# Patient Record
Sex: Female | Born: 2000 | Hispanic: Yes | Marital: Single | Smoking: Never smoker
Health system: Southern US, Community
[De-identification: ages and names within clinical notes are randomized; demographics above are authoritative.]

## PROBLEM LIST (undated history)

## (undated) DIAGNOSIS — Z9889 Other specified postprocedural states: Secondary | ICD-10-CM

## (undated) DIAGNOSIS — R112 Nausea with vomiting, unspecified: Secondary | ICD-10-CM

## (undated) DIAGNOSIS — S83512A Sprain of anterior cruciate ligament of left knee, initial encounter: Secondary | ICD-10-CM

## (undated) DIAGNOSIS — S83289A Other tear of lateral meniscus, current injury, unspecified knee, initial encounter: Secondary | ICD-10-CM

---
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
## 2020-03-10 ENCOUNTER — Other Ambulatory Visit: Payer: Self-pay

## 2020-03-10 ENCOUNTER — Encounter (HOSPITAL_BASED_OUTPATIENT_CLINIC_OR_DEPARTMENT_OTHER): Payer: Self-pay | Admitting: Orthopaedic Surgery

## 2020-03-15 ENCOUNTER — Other Ambulatory Visit (HOSPITAL_COMMUNITY)
Admission: RE | Admit: 2020-03-15 | Discharge: 2020-03-15 | Disposition: A | Discharge status: Home / Self Care | Payer: Medicaid Other | Source: Ambulatory Visit | Attending: Orthopaedic Surgery | Admitting: Orthopaedic Surgery

## 2020-03-15 DIAGNOSIS — Z01812 Encounter for preprocedural laboratory examination: Secondary | ICD-10-CM | POA: Insufficient documentation

## 2020-03-15 DIAGNOSIS — Z20822 Contact with and (suspected) exposure to covid-19: Secondary | ICD-10-CM | POA: Insufficient documentation

## 2020-03-15 LAB — SARS CORONAVIRUS 2 (TAT 6-24 HRS): SARS Coronavirus 2: NEGATIVE

## 2020-03-15 NOTE — Progress Notes (Signed)

## 2020-03-17 NOTE — H&P (Signed)
PREOPERATIVE H&P  Chief Complaint: RIGHT KNEE CRUCIATE LIGAMENT SPRAIN S83.509A  HPI: Jennifer Macias is a 19 y.o. female who is scheduled for  RIGHT KNEE ARTHROSCOPY WITH ANTERIOR CRUCIATE LIGAMENT (ACL) RECONSTRUCTION.   This is a healthy 19 year old who Dr. Everardo Pacific performed a left BTB ACL reconstruction on in December 2018. She unfortunately had an injury to her right knee on July 31 while playing soccer.  She heard a pop.  She had immediate swelling and pain.  Pain has gotten better.  She still has some pain in the back of her knee.    Her symptoms are rated as moderate to severe, and have been worsening.  This is significantly impairing activities of daily living.    Please see clinic note for further details on this patient's care.    She has elected for surgical management.   Past Medical History:  Diagnosis Date  . Lateral meniscus tear 06/2017   left knee  . Left ACL tear 06/2017  . PONV (postoperative nausea and vomiting)    Past Surgical History:  Procedure Laterality Date  . CLOSED REDUCTION AND PERCUTANEOUS PINNING OF HUMERUS FRACTURE Right 09/12/2004  . KNEE ARTHROSCOPY WITH ANTERIOR CRUCIATE LIGAMENT (ACL) REPAIR Left 07/05/2017   Procedure: LEFT KNEE ARTHROSCOPY LATERAL MENISCECTOMY,  ANTERIOR CRUCIATE LIGAMENT AUTOGRAFT BONE TENDON BONE;  Surgeon: Bjorn Pippin, MD;  Location: [facility];  Service: Orthopedics;  Laterality: Left;  . KNEE ARTHROSCOPY WITH LATERAL MENISECTOMY Left 07/05/2017   Procedure: KNEE ARTHROSCOPY WITH LATERAL MENISECTOMY;  Surgeon: Bjorn Pippin, MD;  Location: [facility];  Service: Orthopedics;  Laterality: Left;   Social History   Socioeconomic History  . Marital status: Single    Spouse name: Not on file  . Number of children: Not on file  . Years of education: Not on file  . Highest education level: Not on file  Occupational History  . Not on file  Tobacco Use  . Smoking status: Never  Smoker  . Smokeless tobacco: Never Used  Vaping Use  . Vaping Use: Never used  Substance and Sexual Activity  . Alcohol use: No  . Drug use: No  . Sexual activity: Not on file  Other Topics Concern  . Not on file  Social History Narrative  . Not on file   Social Determinants of Health   Financial Resource Strain:   . Difficulty of Paying Living Expenses: Not on file  Food Insecurity:   . Worried About Programme researcher, broadcasting/film/video in the Last Year: Not on file  . Ran Out of Food in the Last Year: Not on file  Transportation Needs:   . Lack of Transportation (Medical): Not on file  . Lack of Transportation (Non-Medical): Not on file  Physical Activity:   . Days of Exercise per Week: Not on file  . Minutes of Exercise per Session: Not on file  Stress:   . Feeling of Stress : Not on file  Social Connections:   . Frequency of Communication with Friends and Family: Not on file  . Frequency of Social Gatherings with Friends and Family: Not on file  . Attends Religious Services: Not on file  . Active Member of Clubs or Organizations: Not on file  . Attends Banker Meetings: Not on file  . Marital Status: Not on file   Family History  Problem Relation Age of Onset  . Diabetes Maternal Grandmother    No Known Allergies Prior to  Admission medications   Not on File    ROS: All other systems have been reviewed and were otherwise negative with the exception of those mentioned in the HPI and as above.  Physical Exam: General: Alert, no acute distress Cardiovascular: No pedal edema Respiratory: No cyanosis, no use of accessory musculature GI: No organomegaly, abdomen is soft and non-tender Skin: No lesions in the area of chief complaint Neurologic: Sensation intact distally Psychiatric: Patient is competent for consent with normal mood and affect Lymphatic: No axillary or cervical lymphadenopathy  MUSCULOSKELETAL:  Examination of the right knee demonstrates equivocal  Lachman with a bit of increased translation, laterally more than medially.  She is tender to palpation along the lateral joint line.  She has mild effusion.    Imaging: Three views of the knee demonstrate stable and appropriate position of the joint.  No sign of arthritis or other fracture.    MRI of the right knee shows a complete ACL tear  Assessment: RIGHT KNEE CRUCIATE LIGAMENT SPRAIN S83.509A  Plan: Plan for Procedure(s): RIGHT KNEE ARTHROSCOPY WITH ANTERIOR CRUCIATE LIGAMENT (ACL) RECONSTRUCTION  The risks benefits and alternatives were discussed with the patient including but not limited to the risks of nonoperative treatment, versus surgical intervention including infection, bleeding, nerve injury,  blood clots, cardiopulmonary complications, morbidity, mortality, among others, and they were willing to proceed.   The patient acknowledged the explanation, agreed to proceed with the plan and consent was signed.   Operative Plan: Right knee scope + ACL reconstruction with BTB autograft  Discharge Medications: Standard DVT Prophylaxis: Aspirin Physical Therapy: Outpatient PT Special Discharge needs: Knee immobilizer   Vernetta Honey, PA-C  03/17/2020 1:51 PM

## 2020-03-18 ENCOUNTER — Other Ambulatory Visit: Payer: Self-pay

## 2020-03-18 ENCOUNTER — Ambulatory Visit (HOSPITAL_BASED_OUTPATIENT_CLINIC_OR_DEPARTMENT_OTHER): Payer: Medicaid Other | Admitting: Anesthesiology

## 2020-03-18 ENCOUNTER — Encounter (HOSPITAL_BASED_OUTPATIENT_CLINIC_OR_DEPARTMENT_OTHER): Payer: Self-pay | Admitting: Orthopaedic Surgery

## 2020-03-18 ENCOUNTER — Encounter (HOSPITAL_BASED_OUTPATIENT_CLINIC_OR_DEPARTMENT_OTHER)
Admission: RE | Disposition: A | Discharge status: Home / Self Care | Payer: Self-pay | Source: Home / Self Care | Attending: Orthopaedic Surgery

## 2020-03-18 ENCOUNTER — Ambulatory Visit (HOSPITAL_BASED_OUTPATIENT_CLINIC_OR_DEPARTMENT_OTHER)
Admission: RE | Admit: 2020-03-18 | Discharge: 2020-03-18 | Disposition: A | Discharge status: Home / Self Care | Payer: Medicaid Other | Attending: Orthopaedic Surgery | Admitting: Orthopaedic Surgery

## 2020-03-18 DIAGNOSIS — X58XXXA Exposure to other specified factors, initial encounter: Secondary | ICD-10-CM | POA: Diagnosis not present

## 2020-03-18 DIAGNOSIS — S83511A Sprain of anterior cruciate ligament of right knee, initial encounter: Secondary | ICD-10-CM | POA: Insufficient documentation

## 2020-03-18 DIAGNOSIS — Y9366 Activity, soccer: Secondary | ICD-10-CM | POA: Diagnosis not present

## 2020-03-18 HISTORY — DX: Nausea with vomiting, unspecified: R11.2

## 2020-03-18 HISTORY — PX: KNEE ARTHROSCOPY WITH ANTERIOR CRUCIATE LIGAMENT (ACL) REPAIR: SHX5644

## 2020-03-18 HISTORY — DX: Other specified postprocedural states: Z98.890

## 2020-03-18 LAB — POCT PREGNANCY, URINE: Preg Test, Ur: NEGATIVE

## 2020-03-18 SURGERY — KNEE ARTHROSCOPY WITH ANTERIOR CRUCIATE LIGAMENT (ACL) REPAIR
Anesthesia: Regional | Site: Knee | Laterality: Right

## 2020-03-18 MED ORDER — PHENYLEPHRINE HCL (PRESSORS) 10 MG/ML IV SOLN
INTRAVENOUS | Status: DC | PRN
  Administered 2020-03-18 (×2): 80 ug via INTRAVENOUS

## 2020-03-18 MED ORDER — MEPERIDINE HCL 25 MG/ML IJ SOLN
INTRAMUSCULAR | Status: AC
  Filled 2020-03-18: qty 1

## 2020-03-18 MED ORDER — EPHEDRINE 5 MG/ML INJ
INTRAVENOUS | Status: AC
  Filled 2020-03-18: qty 10

## 2020-03-18 MED ORDER — ONDANSETRON HCL 4 MG/2ML IJ SOLN
INTRAMUSCULAR | Status: AC
  Filled 2020-03-18: qty 2

## 2020-03-18 MED ORDER — BUPIVACAINE HCL (PF) 0.25 % IJ SOLN
INTRAMUSCULAR | Status: AC
  Filled 2020-03-18: qty 30

## 2020-03-18 MED ORDER — DEXAMETHASONE SODIUM PHOSPHATE 10 MG/ML IJ SOLN
INTRAMUSCULAR | Status: AC
  Filled 2020-03-18: qty 1

## 2020-03-18 MED ORDER — PROMETHAZINE HCL 25 MG/ML IJ SOLN: 6.2500 mg | INTRAMUSCULAR | Status: DC | PRN

## 2020-03-18 MED ORDER — SODIUM CHLORIDE 0.9 % IR SOLN
Status: DC | PRN
  Administered 2020-03-18: 6000 mL

## 2020-03-18 MED ORDER — PROPOFOL 500 MG/50ML IV EMUL
INTRAVENOUS | Status: AC
  Filled 2020-03-18: qty 150

## 2020-03-18 MED ORDER — CEFAZOLIN SODIUM-DEXTROSE 2-4 GM/100ML-% IV SOLN
2.0000 g | INTRAVENOUS | Status: AC
  Administered 2020-03-18: 10:00:00 2 g via INTRAVENOUS

## 2020-03-18 MED ORDER — FENTANYL CITRATE (PF) 100 MCG/2ML IJ SOLN
INTRAMUSCULAR | Status: AC
  Filled 2020-03-18: qty 2

## 2020-03-18 MED ORDER — DEXAMETHASONE SODIUM PHOSPHATE 10 MG/ML IJ SOLN
INTRAMUSCULAR | Status: AC
  Filled 2020-03-18: qty 2

## 2020-03-18 MED ORDER — ROPIVACAINE HCL 5 MG/ML IJ SOLN
INTRAMUSCULAR | Status: DC | PRN
  Administered 2020-03-18: 30 mL via PERINEURAL

## 2020-03-18 MED ORDER — LIDOCAINE 2% (20 MG/ML) 5 ML SYRINGE
INTRAMUSCULAR | Status: AC
  Filled 2020-03-18: qty 15

## 2020-03-18 MED ORDER — LIDOCAINE 2% (20 MG/ML) 5 ML SYRINGE
INTRAMUSCULAR | Status: AC
  Filled 2020-03-18: qty 5

## 2020-03-18 MED ORDER — EPINEPHRINE PF 1 MG/ML IJ SOLN
INTRAMUSCULAR | Status: AC
  Filled 2020-03-18: qty 2

## 2020-03-18 MED ORDER — ASPIRIN 81 MG PO CHEW: 81.0000 mg | CHEWABLE_TABLET | Freq: Two times a day (BID) | ORAL | 0 refills | Status: AC

## 2020-03-18 MED ORDER — OXYCODONE HCL 5 MG PO TABS: 5.0000 mg | ORAL_TABLET | Freq: Once | ORAL | Status: DC | PRN

## 2020-03-18 MED ORDER — FENTANYL CITRATE (PF) 100 MCG/2ML IJ SOLN
INTRAMUSCULAR | Status: DC | PRN
Start: 2020-03-18 — End: 2020-03-18
  Administered 2020-03-18 (×4): 25 ug via INTRAVENOUS

## 2020-03-18 MED ORDER — PHENYLEPHRINE 40 MCG/ML (10ML) SYRINGE FOR IV PUSH (FOR BLOOD PRESSURE SUPPORT)
PREFILLED_SYRINGE | INTRAVENOUS | Status: AC
  Filled 2020-03-18: qty 10

## 2020-03-18 MED ORDER — OXYCODONE HCL 5 MG PO TABS: ORAL_TABLET | ORAL | 0 refills | Status: AC

## 2020-03-18 MED ORDER — ONDANSETRON HCL 4 MG/2ML IJ SOLN
INTRAMUSCULAR | Status: AC
  Filled 2020-03-18: qty 12

## 2020-03-18 MED ORDER — VANCOMYCIN HCL 1000 MG IV SOLR
INTRAVENOUS | Status: AC
  Filled 2020-03-18: qty 2000

## 2020-03-18 MED ORDER — VANCOMYCIN HCL 1000 MG IV SOLR
INTRAVENOUS | Status: DC | PRN
  Administered 2020-03-18: 1000 mg via TOPICAL

## 2020-03-18 MED ORDER — PROPOFOL 10 MG/ML IV BOLUS
INTRAVENOUS | Status: AC
  Filled 2020-03-18: qty 20

## 2020-03-18 MED ORDER — CEFAZOLIN SODIUM-DEXTROSE 2-4 GM/100ML-% IV SOLN
INTRAVENOUS | Status: AC
  Filled 2020-03-18: qty 100

## 2020-03-18 MED ORDER — MIDAZOLAM HCL 2 MG/2ML IJ SOLN
INTRAMUSCULAR | Status: AC
  Filled 2020-03-18: qty 2

## 2020-03-18 MED ORDER — LIDOCAINE HCL (CARDIAC) PF 100 MG/5ML IV SOSY
PREFILLED_SYRINGE | INTRAVENOUS | Status: DC | PRN
  Administered 2020-03-18: 30 mg via INTRAVENOUS

## 2020-03-18 MED ORDER — DROPERIDOL 2.5 MG/ML IJ SOLN
INTRAMUSCULAR | Status: AC
  Filled 2020-03-18: qty 2

## 2020-03-18 MED ORDER — MIDAZOLAM HCL 2 MG/2ML IJ SOLN
1.0000 mg | Freq: Once | INTRAMUSCULAR | Status: AC
  Administered 2020-03-18: 2 mg via INTRAVENOUS

## 2020-03-18 MED ORDER — MEPERIDINE HCL 25 MG/ML IJ SOLN
6.2500 mg | INTRAMUSCULAR | Status: DC | PRN
  Administered 2020-03-18: 6.25 mg via INTRAVENOUS

## 2020-03-18 MED ORDER — PROPOFOL 10 MG/ML IV BOLUS
INTRAVENOUS | Status: DC | PRN
  Administered 2020-03-18: 200 mg via INTRAVENOUS

## 2020-03-18 MED ORDER — MELOXICAM 7.5 MG PO TABS: 7.5000 mg | ORAL_TABLET | Freq: Every day | ORAL | 0 refills | Status: AC

## 2020-03-18 MED ORDER — HYDROMORPHONE HCL 1 MG/ML IJ SOLN: 0.2500 mg | INTRAMUSCULAR | Status: DC | PRN

## 2020-03-18 MED ORDER — DROPERIDOL 2.5 MG/ML IJ SOLN
INTRAMUSCULAR | Status: DC | PRN
  Administered 2020-03-18: .625 mg via INTRAVENOUS

## 2020-03-18 MED ORDER — LACTATED RINGERS IV SOLN: INTRAVENOUS | Status: DC

## 2020-03-18 MED ORDER — FENTANYL CITRATE (PF) 100 MCG/2ML IJ SOLN
50.0000 ug | Freq: Once | INTRAMUSCULAR | Status: AC
  Administered 2020-03-18: 100 ug via INTRAVENOUS

## 2020-03-18 MED ORDER — DEXAMETHASONE SODIUM PHOSPHATE 10 MG/ML IJ SOLN
INTRAMUSCULAR | Status: DC | PRN
  Administered 2020-03-18: 4 mg via INTRAVENOUS

## 2020-03-18 MED ORDER — ACETAMINOPHEN 500 MG PO TABS: 1000.0000 mg | ORAL_TABLET | Freq: Three times a day (TID) | ORAL | 0 refills | Status: AC

## 2020-03-18 MED ORDER — ONDANSETRON HCL 4 MG/2ML IJ SOLN
INTRAMUSCULAR | Status: DC | PRN
  Administered 2020-03-18: 4 mg via INTRAVENOUS

## 2020-03-18 MED ORDER — OXYCODONE HCL 5 MG/5ML PO SOLN: 5.0000 mg | Freq: Once | ORAL | Status: DC | PRN

## 2020-03-18 MED ORDER — PROPOFOL 500 MG/50ML IV EMUL
INTRAVENOUS | Status: DC | PRN
  Administered 2020-03-18: 50 ug/kg/min via INTRAVENOUS

## 2020-03-18 MED ORDER — ONDANSETRON HCL 4 MG PO TABS: 4.0000 mg | ORAL_TABLET | Freq: Three times a day (TID) | ORAL | 1 refills | Status: AC | PRN

## 2020-03-18 SURGICAL SUPPLY — 87 items
APL PRP STRL LF DISP 70% ISPRP (MISCELLANEOUS) ×1
BLADE AVERAGE 25MMX9MM (BLADE) ×1
BLADE AVERAGE 25X9 (BLADE) ×2 IMPLANT
BLADE SHAVER BONE 5.0MM X 13CM (MISCELLANEOUS) ×1
BLADE SHAVER BONE 5.0X13 (MISCELLANEOUS) ×2 IMPLANT
BLADE SURG 10 STRL SS (BLADE) ×9 IMPLANT
BLADE SURG 15 STRL LF DISP TIS (BLADE) ×2 IMPLANT
BLADE SURG 15 STRL SS (BLADE) ×6
BNDG ELASTIC 6X5.8 VLCR STR LF (GAUZE/BANDAGES/DRESSINGS) ×3 IMPLANT
BONE TUNNEL PLUG CANNULATED (MISCELLANEOUS) ×3 IMPLANT
BURR OVAL 8 FLU 4.0MM X 13CM (MISCELLANEOUS)
BURR OVAL 8 FLU 4.0X13 (MISCELLANEOUS) IMPLANT
CHLORAPREP W/TINT 26 (MISCELLANEOUS) ×3 IMPLANT
CLOSURE STERI-STRIP 1/2X4 (GAUZE/BANDAGES/DRESSINGS) ×1
CLSR STERI-STRIP ANTIMIC 1/2X4 (GAUZE/BANDAGES/DRESSINGS) ×2 IMPLANT
COLLECTOR GRAFT TISSUE (SYSTAGENIX WOUND MANAGEMENT) ×3
COVER BACK TABLE 60X90IN (DRAPES) ×3 IMPLANT
COVER WAND RF STERILE (DRAPES) IMPLANT
CUFF TOURN SGL QUICK 24 (TOURNIQUET CUFF) ×3
CUFF TOURN SGL QUICK 34 (TOURNIQUET CUFF)
CUFF TRNQT CYL 24X4X16.5-23 (TOURNIQUET CUFF) ×1 IMPLANT
CUFF TRNQT CYL 34X4.125X (TOURNIQUET CUFF) IMPLANT
DECANTER SPIKE VIAL GLASS SM (MISCELLANEOUS) IMPLANT
DISSECTOR 3.5MM X 13CM CVD (MISCELLANEOUS) IMPLANT
DISSECTOR 4.0MMX13CM CVD (MISCELLANEOUS) IMPLANT
DRAPE ARTHROSCOPY W/POUCH 90 (DRAPES) ×3 IMPLANT
DRAPE IMP U-DRAPE 54X76 (DRAPES) ×3 IMPLANT
DRAPE TOP ARMCOVERS (MISCELLANEOUS) ×3 IMPLANT
DRAPE U-SHAPE 47X51 STRL (DRAPES) ×3 IMPLANT
ELECT REM PT RETURN 9FT ADLT (ELECTROSURGICAL) ×3
ELECTRODE REM PT RTRN 9FT ADLT (ELECTROSURGICAL) ×1 IMPLANT
GAUZE SPONGE 4X4 12PLY STRL (GAUZE/BANDAGES/DRESSINGS) ×6 IMPLANT
GLOVE BIO SURGEON STRL SZ 6.5 (GLOVE) ×2 IMPLANT
GLOVE BIO SURGEON STRL SZ7.5 (GLOVE) ×3 IMPLANT
GLOVE BIO SURGEONS STRL SZ 6.5 (GLOVE) ×1
GLOVE BIOGEL PI IND STRL 6.5 (GLOVE) ×1 IMPLANT
GLOVE BIOGEL PI IND STRL 7.0 (GLOVE) ×1 IMPLANT
GLOVE BIOGEL PI IND STRL 8 (GLOVE) ×2 IMPLANT
GLOVE BIOGEL PI INDICATOR 6.5 (GLOVE) ×2
GLOVE BIOGEL PI INDICATOR 7.0 (GLOVE) ×2
GLOVE BIOGEL PI INDICATOR 8 (GLOVE) ×4
GLOVE ECLIPSE 8.0 STRL XLNG CF (GLOVE) ×3 IMPLANT
GLOVE SURG SS PI 7.0 STRL IVOR (GLOVE) ×3 IMPLANT
GOWN STRL REUS W/ TWL LRG LVL3 (GOWN DISPOSABLE) ×3 IMPLANT
GOWN STRL REUS W/TWL LRG LVL3 (GOWN DISPOSABLE) ×9
GOWN STRL REUS W/TWL XL LVL3 (GOWN DISPOSABLE) ×3 IMPLANT
GUIDEPIN FLEX PATHFINDER 2.4MM (WIRE) ×3 IMPLANT
IMMOBILIZER KNEE 20 (SOFTGOODS) ×3
IMMOBILIZER KNEE 20 THIGH 36 (SOFTGOODS) ×1 IMPLANT
IMMOBILIZER KNEE 22 UNIV (SOFTGOODS) IMPLANT
IMMOBILIZER KNEE 24 THIGH 36 (MISCELLANEOUS) IMPLANT
IMMOBILIZER KNEE 24 UNIV (MISCELLANEOUS)
IMP SYS 2ND FIX PEEK 4.75X19.1 (Miscellaneous) ×3 IMPLANT
IMPL SYS 2ND FX PEEK 4.75X19.1 (Miscellaneous) ×1 IMPLANT
IV NS IRRIG 3000ML ARTHROMATIC (IV SOLUTION) ×12 IMPLANT
KIT TRANSTIBIAL (DISPOSABLE) ×3 IMPLANT
KNEE WRAP E Z 3 GEL PACK (MISCELLANEOUS) ×3 IMPLANT
KNIFE GRAFT ACL 10MM 5952 (MISCELLANEOUS) ×3 IMPLANT
KNIFE GRAFT ACL 9MM (MISCELLANEOUS) IMPLANT
MANIFOLD NEPTUNE II (INSTRUMENTS) ×3 IMPLANT
NDL SAFETY ECLIPSE 18X1.5 (NEEDLE) ×1 IMPLANT
NEEDLE HYPO 18GX1.5 SHARP (NEEDLE) ×3
NS IRRIG 1000ML POUR BTL (IV SOLUTION) ×3 IMPLANT
PACK ARTHROSCOPY DSU (CUSTOM PROCEDURE TRAY) ×3 IMPLANT
PACK BASIN DAY SURGERY FS (CUSTOM PROCEDURE TRAY) ×3 IMPLANT
PENCIL SMOKE EVACUATOR (MISCELLANEOUS) IMPLANT
PORT APPOLLO RF 90DEGREE MULTI (SURGICAL WAND) ×3 IMPLANT
REAMER FLEX GUIDE PIN 9.5MM (DRILL) ×1 IMPLANT
REAMER/FLEX GUIDE PIN 9.5MM (DRILL) ×3
SCREW INTERFERENCE 8X20MM (Screw) ×3 IMPLANT
SCREW SHEATHED INTERF 7X25 (Screw) ×3 IMPLANT
SLEEVE SCD COMPRESS KNEE MED (MISCELLANEOUS) IMPLANT
SPONGE LAP 4X18 RFD (DISPOSABLE) ×3 IMPLANT
SUT FIBERWIRE #2 38 T-5 BLUE (SUTURE) ×15
SUT MNCRL AB 4-0 PS2 18 (SUTURE) ×3 IMPLANT
SUT VIC AB 0 CT1 27 (SUTURE) ×3
SUT VIC AB 0 CT1 27XBRD ANBCTR (SUTURE) ×1 IMPLANT
SUT VIC AB 3-0 SH 27 (SUTURE) ×3
SUT VIC AB 3-0 SH 27X BRD (SUTURE) ×1 IMPLANT
SUTURE FIBERWR #2 38 T-5 BLUE (SUTURE) ×5 IMPLANT
SYR 5ML LL (SYRINGE) ×3 IMPLANT
TISSUE GRAFT COLLECTOR (SYSTAGENIX WOUND MANAGEMENT) ×1 IMPLANT
TOWEL GREEN STERILE FF (TOWEL DISPOSABLE) ×6 IMPLANT
TUBE CONNECTING 20'X1/4 (TUBING) ×1
TUBE CONNECTING 20X1/4 (TUBING) ×2 IMPLANT
TUBE SUCTION HIGH CAP CLEAR NV (SUCTIONS) ×3 IMPLANT
TUBING ARTHROSCOPY IRRIG 16FT (MISCELLANEOUS) ×3 IMPLANT

## 2020-03-18 NOTE — Progress Notes (Signed)
1222 Demerol given for severe shaking.

## 2020-03-18 NOTE — Anesthesia Preprocedure Evaluation (Signed)
Anesthesia Evaluation  Patient identified by MRN, date of birth, ID band Patient awake    Reviewed: Allergy & Precautions, NPO status , Patient's Chart, lab work & pertinent test results  History of Anesthesia Complications (+) PONV  Airway Mallampati: II  TM Distance: >3 FB Neck ROM: Full    Dental no notable dental hx. 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  Reproductive/Obstetrics                             Anesthesia Physical  Anesthesia Plan  ASA: I  Anesthesia Plan: General and Regional   Post-op Pain Management: GA combined w/ Regional for post-op pain   Induction: Intravenous  PONV Risk Score and Plan: 4 or greater and Midazolam, Dexamethasone, Ondansetron, Treatment may vary due to age or medical condition and Droperidol  Airway Management Planned: LMA  Additional Equipment:   Intra-op Plan:   Post-operative Plan: Extubation in OR  Informed Consent: I have reviewed the patients History and Physical, chart, labs and discussed the procedure including the risks, benefits and alternatives for the proposed anesthesia with the patient or authorized representative who has indicated his/her understanding and acceptance.     Dental advisory given  Plan Discussed with: CRNA  Anesthesia Plan Comments:         Anesthesia Quick Evaluation

## 2020-03-18 NOTE — Anesthesia Preprocedure Evaluation (Deleted)
Anesthesia Evaluation    History of Anesthesia Complications (+) PONV  Airway Mallampati: II  TM Distance: >3 FB Neck ROM: Full    Dental no notable dental hx.    Pulmonary    Pulmonary exam normal breath sounds clear to auscultation       Cardiovascular negative cardio ROS Normal cardiovascular exam Rhythm:Regular Rate:Normal     Neuro/Psych    GI/Hepatic   Endo/Other    Renal/GU      Musculoskeletal   Abdominal   Peds  Hematology   Anesthesia Other Findings   Reproductive/Obstetrics                                                             Anesthesia Evaluation  Patient identified by MRN, date of birth, ID band Patient awake    Reviewed: Allergy & Precautions, NPO status , Patient's Chart, lab work & pertinent test results  Airway Mallampati: II  TM Distance: >3 FB Neck ROM: Full    Dental no notable dental hx. 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  Reproductive/Obstetrics                            Anesthesia Physical Anesthesia Plan  ASA: I  Anesthesia Plan: General and Regional   Post-op Pain Management: GA combined w/ Regional for post-op pain   Induction: Intravenous  PONV Risk Score and Plan: 3 and Midazolam, Dexamethasone, Ondansetron and Treatment may vary due to age or medical condition  Airway Management Planned:  LMA  Additional Equipment:   Intra-op Plan:   Post-operative Plan: Extubation in OR  Informed Consent: I have reviewed the patients History and Physical, chart, labs and discussed the procedure including the risks, benefits and alternatives for the proposed anesthesia with the patient or authorized representative who has indicated his/her understanding and acceptance.   Dental advisory given  Plan Discussed with: CRNA  Anesthesia Plan Comments:        Anesthesia Quick Evaluation  Anesthesia Physical Anesthesia Plan  ASA: II  Anesthesia Plan: General   Post-op Pain Management:  Regional for Post-op pain   Induction: Intravenous  PONV Risk Score and Plan: 4 or greater and Ondansetron, Dexamethasone, Midazolam, Droperidol and Treatment may vary due to age or medical condition  Airway Management Planned: LMA  Additional Equipment:   Intra-op Plan:   Post-operative Plan: Extubation in OR  Informed Consent: I have reviewed the patients History and Physical, chart, labs and discussed the procedure including the risks, benefits and alternatives for the proposed anesthesia with the patient or authorized representative who has indicated his/her understanding and acceptance.     Dental advisory given  Plan Discussed with: Anesthesiologist  Anesthesia Plan Comments:  Anesthesia Quick Evaluation  

## 2020-03-18 NOTE — Progress Notes (Signed)
AssistedDr. Miller with right, ultrasound guided, adductor canal block. Side rails up, monitors on throughout procedure. See vital signs in flow sheet. Tolerated Procedure well.  

## 2020-03-18 NOTE — Anesthesia Procedure Notes (Signed)
Anesthesia Regional Block: Adductor canal block   Pre-Anesthetic Checklist: ,, timeout performed, Correct Patient, Correct Site, Correct Laterality, Correct Procedure, Correct Position, site marked, Risks and benefits discussed,  Surgical consent,  Pre-op evaluation,  At surgeon's request and post-op pain management  Laterality: Right  Prep: chloraprep       Needles:  Injection technique: Single-shot  Needle Type: Stimiplex     Needle Length: 9cm  Needle Gauge: 21     Additional Needles:   Procedures:,,,, ultrasound used (permanent image in chart),,,,  Narrative:  Start time: 03/18/2020 9:53 AM End time: 03/18/2020 9:58 AM Injection made incrementally with aspirations every 5 mL.  Performed by: Personally  Anesthesiologist: Lowella Curb, MD

## 2020-03-18 NOTE — Discharge Instructions (Signed)

## 2020-03-18 NOTE — Anesthesia Postprocedure Evaluation (Signed)
Anesthesia Post Note  Patient: Jennifer Macias  Procedure(s) Performed: RIGHT KNEE ARTHROSCOPY WITH ANTERIOR CRUCIATE LIGAMENT (ACL) RECONSTRUCTION (Right Knee)     Patient location during evaluation: PACU Anesthesia Type: Regional Level of consciousness: awake and alert Pain management: pain level controlled Vital Signs Assessment: post-procedure vital signs reviewed and stable Respiratory status: spontaneous breathing, nonlabored ventilation and respiratory function stable Cardiovascular status: blood pressure returned to baseline and stable Postop Assessment: no apparent nausea or vomiting Anesthetic complications: no   No complications documented.  Last Vitals:  Vitals:   03/18/20 1316 03/18/20 1317  BP: 118/83   Pulse:  69  Resp: 18 12  Temp:    SpO2:  100%    Last Pain:  Vitals:   03/18/20 1300  TempSrc:   PainSc: 0-No pain                 Lowella Curb

## 2020-03-18 NOTE — Transfer of Care (Signed)
Immediate Anesthesia Transfer of Care Note  Patient: Jennifer Macias  Procedure(s) Performed: RIGHT KNEE ARTHROSCOPY WITH ANTERIOR CRUCIATE LIGAMENT (ACL) RECONSTRUCTION (Right Knee)  Patient Location: PACU  Anesthesia Type:GA combined with regional for post-op pain  Level of Consciousness: drowsy and patient cooperative  Airway & Oxygen Therapy: Patient Spontanous Breathing and Patient connected to face mask oxygen  Post-op Assessment: Report given to RN and Post -op Vital signs reviewed and stable  Post vital signs: Reviewed and stable  Last Vitals:  Vitals Value Taken Time  BP 92/51 03/18/20 1211  Temp    Pulse 69 03/18/20 1212  Resp 8 03/18/20 1212  SpO2 100 % 03/18/20 1212  Vitals shown include unvalidated device data.  Last Pain:  Vitals:   03/18/20 0935  TempSrc: Oral  PainSc: 3       Patients Stated Pain Goal: 5 (03/18/20 0935)  Complications: No complications documented.

## 2020-03-18 NOTE — Anesthesia Procedure Notes (Signed)
Procedure Name: LMA Insertion Date/Time: 03/18/2020 10:26 AM Performed by: Sheryn Bison, CRNA Pre-anesthesia Checklist: Patient identified, Emergency Drugs available, Suction available and Patient being monitored Patient Re-evaluated:Patient Re-evaluated prior to induction Oxygen Delivery Method: Circle system utilized Preoxygenation: Pre-oxygenation with 100% oxygen Induction Type: IV induction Ventilation: Mask ventilation without difficulty LMA: LMA inserted LMA Size: 4.0 Number of attempts: 1 Airway Equipment and Method: Bite block Placement Confirmation: positive ETCO2 Tube secured with: Tape Dental Injury: Teeth and Oropharynx as per pre-operative assessment

## 2020-03-18 NOTE — Op Note (Signed)
Orthopaedic Surgery Operative Note (CSN: [id number])  Jennifer Macias Crystal  Jun 15, 2001 Date of Surgery: 03/18/2020   Diagnoses:  Right ACL rupture  Procedure: Right BTB ACL reconstruction autograft   Operative Finding Exam under anesthesia: Full motion no limitation, no hyperextension, grade 2B Lachman no external rotation dial abnormalities or varus valgus instability Suprapatellar pouch: Normal Patellofemoral Compartment: Normal Medial Compartment: Normal Lateral Compartment: Normal Intercondylar Notch: Complete ACL rupture  Successful completion of the planned procedure. Great stability of graft and minimal mismatch of about 3 mm. We did still backup our fixation with a swivel lock but we had great fixation with our screw just since we had slight mismatch.  Post-operative plan: The patient will be weightbearing to tolerance with the brace locked in extension.  The patient will be discharged home.  DVT prophylaxis Aspirin 81 mg twice daily for 6 weeks.  Pain control with PRN pain medication preferring oral medicines.  Follow up plan will be scheduled in approximately 7 days for incision check and XR.  Post-Op Diagnosis: Same Surgeons:Primary: Bjorn Pippin, MD Assistants:Caroline McBane PA-C Location: [facility] OR ROOM 5 Anesthesia: General with abductor canal Antibiotics: Ancef 2 g with local vancomycin powder 1 g at the surgical site Tourniquet time:  Total Tourniquet Time Documented: Thigh (Right) - 81 minutes Total: Thigh (Right) - 81 minutes  Estimated Blood Loss: Minimal Complications: None Specimens: None Implants: Implant Name Type Inv. Item Serial No. Manufacturer Lot No. LRB No. Used Action  SCREW SHEATHED INTERF 7X25MM - [id number] Screw SCREW SHEATHED INTERF 7X25MM  [employer] [id number] Right 1 Implanted  SCREW INTERFERENCE 8X20MM - [id number] Screw SCREW INTERFERENCE 8X20MM  [employer] [id number] Right 1 Implanted  IMP SYS 2ND FIX PEEK 4.75X19.1 - [id number] Miscellaneous  IMP SYS 2ND FIX PEEK 4.75X19.1  [employer] [id number] Right 1 Implanted    Indications for Surgery:   Jennifer Macias is a 19 y.o. female with soccer related noncontact injury resulting in ACL rupture. MRI also suggested a lateral ligament sprain but her dial testing was normal in the clinic. Benefits and risks of operative and nonoperative management were discussed prior to surgery with patient/guardian(s) and informed consent form was completed.  Specific risks including infection, need for additional surgery, repeat rupture, stiffness, harvest issues and patellar fracture amongst others   Procedure:   The patient was identified properly. Informed consent was obtained and the surgical site was marked. The patient was taken up to suite where general anesthesia was induced. The patient was placed in the supine position with a post against the surgical leg and a nonsterile tourniquet applied. The surgical leg was then prepped and draped usual sterile fashion.  A standard surgical timeout was performed.  2 standard anterior portals were made and diagnostic arthroscopy performed. Please note the findings as noted above.  We began by making an incision along the medial third of the patellar tendon in line with the tendon itself starting at the level of the distal pole of the patella ending 3 cm distal to the insertion on the tubercle. We carried the incision down sharply achieving hemostasis 3 progressed identifying the tissue plane of the peritenon. The created skin flaps medially and laterally taking care to avoid damage to the superficial skin. This point the peritenon was incised sharply in line with the tendon and again flaps are created exposing the medial and lateral borders of the tendon. We then took care to ensure that there is appropriate visualization for forearm harvest within our incision using  a mobile window technique.  We then used a double bladed scalpel incised the tendon  longitudinally 10 mm wide. This incision within the tendon was carried proximal and distally onto the tubercle and proximal wound patella to create a 25 mm bone block from patella and a 27 mm bone block from the tibia. We made our longitudinal cuts with a saw taking great care to avoid any additional transverse cut in the patella to minimize the chance of stress riser and fracture. The harvest went without issue and graft was taken to the back table.    This point we closed the defect in the patellar tendon after identifying that there was appropriate medial and lateral tendon still intact.   We began arthroscopy and made our lateral and medial portals within our incision on each side of the tendon. Fat pad was resected and diagnostic arthroscopy performed with the findings listed above.   The anterior cruciate ligament stump was debrided utilizing a shaver taking great care to preserve the remnant stump on the femur and the tibia for localization of our tunnels. Once the remnant anterior cruciate ligament was removed and we obtained appropriate visualization by performing a small notchplasty and confirmed that we had indeed identify the over-the-top position. We made small marks at the location of the aperture of the tibial and femoral tunnels and double checked our location prior to drilling.  We then used a [employer] tibial guide to ream a 10 mm tunnel from 1.5 cm medial to the tibial tubercle to our mark for the tibial aperture. At this point tunnels cleared of debris and once we verified there were happy with our tunnel we utilized a [employer] guide with 7 mm offset to create our femoral tunnel. A flexible guidepin was placed into the tibial tunnel and into the guide itself and directed at the footprint of the anterior cruciate ligament. We then ensured that the knee was at 90 of flexion and passed the flexible guidepin out the lateral cortex of the femur and through the skin without issue.  We verified that we were in the anterior half of the femur to avoid posterior blowout prior to reaming our 9.5 mm femoral tunnel. Flexible reamers used to perform this taking great care to not run on power through the tibial tunnel to avoid moving the tibial tunnel more posterior.    We then created our femoral tunnel and cleared it of any bony debris using suction and shaver. We double checked that the posterior wall was intact prior to proceeding with passing the graft. A shuttling stitch was passed and the graft was passed without issue and the graft was shuttled into the knee in the correct orientation.    Femoral fixation was with a 7 x 25 mm metal [employer] screw. We obtained good purchase with the screw. We verified arthroscopically that there is no sign of graft impingement on the notch. We then cycled the knee multiple times and turned our attention to the tibia.  Tibia was fixed with a 8 x 20 mm metal [employer] screw and the graft was extremely rotated 90 to anteriorize the tendinous portion within the joint. We achieved good purchase of the graft and there was minimal mismatch of about 3 mm. Due to the mismatch we did feel that even though we had good screw fixation backing up her fixation would be reasonable. We used a swivel lock, peek 4.75 to backup the fixation in the tibia in standard fashion.  At  this point a gentle Lachman maneuver was performed and there is a stable endpoint and little translation.  Autograft harvested from left over graft prep as well as reamings was used to bone graft the patella as well as the tibial defects the peritenon was closed.  Incision was closed in multilayer fashion with absorbable suture and Steri-Strips placed. Sterile dressing and knee immobilizer were placed and patient taken to PACU without adverse event.     Incisions closed with absorbable suture. The patient was awoken from general anesthesia and taken to the PACU in stable condition without  complication.   Alfonse Alpers, PA-C, present and scrubbed throughout the case, critical for completion in a timely fashion, and for retraction, instrumentation, closure.

## 2020-03-18 NOTE — Interval H&P Note (Signed)
History and Physical Interval Note:  03/18/2020 9:23 AM  Jennifer Macias  has presented today for surgery, with the diagnosis of RIGHT KNEE CRUCIATE LIGAMENT SPRAIN S83.509A.  The various methods of treatment have been discussed with the patient and family. After consideration of risks, benefits and other options for treatment, the patient has consented to  Procedure(s): RIGHT KNEE ARTHROSCOPY WITH ANTERIOR CRUCIATE LIGAMENT (ACL) RECONSTRUCTION (Right) as a surgical intervention.  The patient's history has been reviewed, patient examined, no change in status, stable for surgery.  I have reviewed the patient's chart and labs.  Questions were answered to the patient's satisfaction.    Discussed video recording for educational purposes and patient wants to proceed with this.   Bjorn Pippin

## 2020-03-19 ENCOUNTER — Encounter (HOSPITAL_BASED_OUTPATIENT_CLINIC_OR_DEPARTMENT_OTHER): Payer: Self-pay | Admitting: Orthopaedic Surgery

## 2022-02-21 ENCOUNTER — Emergency Department (HOSPITAL_COMMUNITY): Payer: Medicaid Other

## 2022-02-21 ENCOUNTER — Other Ambulatory Visit: Payer: Self-pay

## 2022-02-21 ENCOUNTER — Emergency Department (HOSPITAL_COMMUNITY)
Admission: EM | Admit: 2022-02-21 | Discharge: 2022-02-21 | Disposition: A | Discharge status: Home / Self Care | Payer: Medicaid Other | Attending: Emergency Medicine | Admitting: Emergency Medicine

## 2022-02-21 ENCOUNTER — Encounter (HOSPITAL_COMMUNITY): Payer: Self-pay | Admitting: Emergency Medicine

## 2022-02-21 DIAGNOSIS — R110 Nausea: Secondary | ICD-10-CM | POA: Insufficient documentation

## 2022-02-21 DIAGNOSIS — R1032 Left lower quadrant pain: Secondary | ICD-10-CM | POA: Insufficient documentation

## 2022-02-21 DIAGNOSIS — N9489 Other specified conditions associated with female genital organs and menstrual cycle: Secondary | ICD-10-CM | POA: Diagnosis not present

## 2022-02-21 DIAGNOSIS — R1084 Generalized abdominal pain: Secondary | ICD-10-CM

## 2022-02-21 DIAGNOSIS — R1031 Right lower quadrant pain: Secondary | ICD-10-CM | POA: Insufficient documentation

## 2022-02-21 DIAGNOSIS — R1013 Epigastric pain: Secondary | ICD-10-CM | POA: Diagnosis not present

## 2022-02-21 LAB — COMPREHENSIVE METABOLIC PANEL
ALT: 11 U/L (ref 0–44)
AST: 15 U/L (ref 15–41)
Albumin: 3.9 g/dL (ref 3.5–5.0)
Alkaline Phosphatase: 71 U/L (ref 38–126)
Anion gap: 6 (ref 5–15)
BUN: 8 mg/dL (ref 6–20)
CO2: 23 mmol/L (ref 22–32)
Calcium: 9 mg/dL (ref 8.9–10.3)
Chloride: 110 mmol/L (ref 98–111)
Creatinine, Ser: 0.68 mg/dL (ref 0.44–1.00)
GFR, Estimated: >60 mL/min (ref >60)
Glucose, Bld: 91 mg/dL (ref 70–99)
Potassium: 4.5 mmol/L (ref 3.5–5.1)
Sodium: 139 mmol/L (ref 135–145)
Total Bilirubin: 0.5 mg/dL (ref 0.3–1.2)
Total Protein: 7.1 g/dL (ref 6.5–8.1)

## 2022-02-21 LAB — CBC
HCT: 43.1 % (ref 36.0–46.0)
Hemoglobin: 13.9 g/dL (ref 12.0–15.0)
MCH: 30 pg (ref 26.0–34.0)
MCHC: 32.3 g/dL (ref 30.0–36.0)
MCV: 93.1 fL (ref 80.0–100.0)
Platelets: 266 10*3/uL (ref 150–400)
RBC: 4.63 MIL/uL (ref 3.87–5.11)
RDW: 12.5 % (ref 11.5–15.5)
WBC: 8.5 10*3/uL (ref 4.0–10.5)
nRBC: 0 % (ref 0.0–0.2)

## 2022-02-21 LAB — URINALYSIS, ROUTINE W REFLEX MICROSCOPIC
Bacteria, UA: NONE SEEN
Bilirubin Urine: NEGATIVE
Glucose, UA: NEGATIVE mg/dL
Ketones, ur: NEGATIVE mg/dL
Nitrite: NEGATIVE
Protein, ur: NEGATIVE mg/dL
Specific Gravity, Urine: 1.013 (ref 1.005–1.030)
pH: 5 (ref 5.0–8.0)

## 2022-02-21 LAB — I-STAT BETA HCG BLOOD, ED (MC, WL, AP ONLY): I-stat hCG, quantitative: <5 m[IU]/mL (ref <5)

## 2022-02-21 LAB — LIPASE, BLOOD: Lipase: 27 U/L (ref 11–51)

## 2022-02-21 MED ORDER — IOHEXOL 300 MG/ML  SOLN
100.0000 mL | Freq: Once | INTRAMUSCULAR | Status: AC | PRN
  Administered 2022-02-21: 100 mL via INTRAVENOUS

## 2022-02-21 NOTE — ED Provider Notes (Signed)
Patient signed out to me awaiting CT scan abdomen pelvis.  Blood work is unremarkable.  Suspect likely gas related pain.  CT scan shows no acute findings.  Left-sided ovarian cyst but pain is mostly in the right side.  No concern for ovarian torsion.  She is pain-free on my reevaluation.  Discharged in good condition.  Understands return precautions  This chart was dictated using voice recognition software.  Despite best efforts to proofread,  errors can occur which can change the documentation meaning.    Virgina Norfolk, DO 02/21/22 1729

## 2022-02-21 NOTE — ED Provider Triage Note (Signed)
Emergency Medicine Provider Triage Evaluation Note  Jennifer Macias , a 21 y.o. female  was evaluated in triage.  Pt complains of abdominal pain.  Patient reports right lower quadrant pain started on Thursday, over the weekend it seemed to move across her lower abdomen and into her low back but is still most severe in the right side.  Reports that she started her menstrual cycle on Friday with this is not typical cramping pain for her.  No dysuria, some nausea but no vomiting, no diarrhea or constipation, no fevers or chills.  No prior abdominal surgeries.  Review of Systems  Positive: Abdominal pain, nausea, vaginal bleeding Negative: Fevers, vomiting, diarrhea  Physical Exam  BP 114/64 (BP Location: Right Arm)   Pulse 65   Temp 98.6 F (37 C) (Oral)   Resp 14   SpO2 100%  Gen:   Awake, no distress   Resp:  Normal effort  MSK:   Moves extremities without difficulty  Other:  Right lower quadrant abdominal pain without guarding or peritoneal signs  Medical Decision Making  Medically screening exam initiated at 11:44 AM.  Appropriate orders placed.  LUARA FAYE was informed that the remainder of the evaluation will be completed by another provider, this initial triage assessment does not replace that evaluation, and the importance of remaining in the ED until their evaluation is complete.  Abdominal labs and CT abdomen pelvis ordered to assess for potential appendicitis   Dartha Lodge, PA-C 02/21/22 1150

## 2022-02-21 NOTE — ED Triage Notes (Signed)
Patient ocmplains of RLQ abdominal pain that started Thursday, then on Saturday spread across to LLQ too, then Sunday pain spread to right and left lower back but is more intense on right. Patient denies any urinary complains, denies diarrhea.

## 2022-02-21 NOTE — ED Provider Notes (Signed)
[facility] EMERGENCY DEPARTMENT Provider Note   CSN: [id number] Arrival date & time: 02/21/22  1105     History  Chief Complaint  Patient presents with   Abdominal Pain    Jennifer Macias is a 21 y.o. female.  HPI 21 year old female presents with abdominal pain.  Started on the evening of 8/3.  Did not take too much of it.  Started her menstrual cycle the next day but the pain she is having is more of a throbbing pain rather than a cramping it feels different.  Started in the right lower quadrant.  Few days ago it became diffuse across her lower abdomen.  Still worse on the right side but overall diffuse.  Now is somewhat in her back.  Currently the pain is pretty mild and she declines pain medicine.  She has had some nausea without vomiting.  She denies fevers, diarrhea or constipation.  No urinary or vaginal symptoms besides a typical menstrual cycle.  No concern for vaginal infection.  Home Medications Prior to Admission medications   Not on File      Allergies    Patient has no known allergies.    Review of Systems   Review of Systems  Constitutional:  Negative for fever.  Gastrointestinal:  Positive for abdominal pain and nausea. Negative for constipation, diarrhea and vomiting.  Genitourinary:  Positive for vaginal bleeding. Negative for dysuria, vaginal discharge and vaginal pain.    Physical Exam Updated Vital Signs BP 120/60 (BP Location: Right Arm)   Pulse 65   Temp 98 F (36.7 C) (Oral)   Resp 15   SpO2 100%  Physical Exam Vitals and nursing note reviewed.  Constitutional:      General: She is not in acute distress.    Appearance: She is well-developed. She is not ill-appearing or diaphoretic.  HENT:     Head: Normocephalic and atraumatic.  Cardiovascular:     Rate and Rhythm: Normal rate and regular rhythm.     Heart sounds: Normal heart sounds.  Pulmonary:     Effort: Pulmonary effort is normal.     Breath sounds: Normal  breath sounds.  Abdominal:     Palpations: Abdomen is soft.     Tenderness: There is abdominal tenderness (mild tenderness on exam. no focal tenderness) in the right lower quadrant, epigastric area and left lower quadrant. There is no right CVA tenderness or left CVA tenderness.  Skin:    General: Skin is warm and dry.  Neurological:     Mental Status: She is alert.     ED Results / Procedures / Treatments   Labs (all labs ordered are listed, but only abnormal results are displayed) Labs Reviewed  URINALYSIS, ROUTINE W REFLEX MICROSCOPIC - Abnormal; Notable for the following components:      Result Value   APPearance HAZY (*)    Hgb urine dipstick LARGE (*)    Leukocytes,Ua SMALL (*)    All other components within normal limits  LIPASE, BLOOD  COMPREHENSIVE METABOLIC PANEL  CBC  I-STAT BETA HCG BLOOD, ED ([facility], [facility], [facility] ONLY)    EKG None  Radiology No results found.  Procedures Procedures    Medications Ordered in ED Medications - No data to display  ED Course/ Medical Decision Making/ A&P                           Medical Decision Making Amount and/or Complexity of Data  Reviewed Labs: ordered.   Patient's lab work interpreted by myself and she is not pregnant, has normal WBC, normal renal function and LFTs, and hemoglobin.  Blood in urine correlates with her being on her menstrual cycle and her urine is otherwise not consistent with UTI. She has no urinary tract infection symptoms.  Abdominal exam shows minimal tenderness but I think it is reasonable to get a CT as appendicitis is still in the differential.  Ovarian torsion would also be on the differential but pretty low given fairly nonfocal symptoms at this point.  My suspicion for STI/PID is fairly low as well.  Currently waiting on CT.  Care transferred to Dr. Lockie Mola.        Final Clinical Impression(s) / ED Diagnoses Final diagnoses:  None    Rx / DC Orders ED Discharge Orders     None          Pricilla Loveless, MD 02/21/22 1511
